# Patient Record
Sex: Male | Born: 1940 | Race: White | Hispanic: No | Marital: Married | Smoking: Never smoker
Health system: Southern US, Community
[De-identification: ages and names within clinical notes are randomized; demographics above are authoritative.]

## PROBLEM LIST (undated history)

## (undated) DIAGNOSIS — E785 Hyperlipidemia, unspecified: Secondary | ICD-10-CM

## (undated) HISTORY — DX: Hyperlipidemia, unspecified: E78.5

---

## 1999-02-28 ENCOUNTER — Ambulatory Visit (HOSPITAL_BASED_OUTPATIENT_CLINIC_OR_DEPARTMENT_OTHER): Admission: RE | Admit: 1999-02-28 | Discharge: 1999-02-28 | Payer: Self-pay | Admitting: Orthopaedic Surgery

## 2000-07-15 ENCOUNTER — Encounter: Admission: RE | Admit: 2000-07-15 | Discharge: 2000-07-15 | Payer: Self-pay | Admitting: Family Medicine

## 2000-07-15 ENCOUNTER — Encounter: Payer: Self-pay | Admitting: Family Medicine

## 2002-05-20 ENCOUNTER — Encounter: Admission: RE | Admit: 2002-05-20 | Discharge: 2002-05-20 | Payer: Self-pay | Admitting: Family Medicine

## 2002-05-20 ENCOUNTER — Encounter: Payer: Self-pay | Admitting: Family Medicine

## 2009-04-26 ENCOUNTER — Ambulatory Visit: Payer: Self-pay | Admitting: Gastroenterology

## 2011-11-06 ENCOUNTER — Ambulatory Visit: Payer: Self-pay | Admitting: Anesthesiology

## 2011-11-08 ENCOUNTER — Ambulatory Visit: Payer: Self-pay | Admitting: Podiatry

## 2012-08-12 ENCOUNTER — Other Ambulatory Visit (HOSPITAL_COMMUNITY): Payer: Self-pay | Admitting: Internal Medicine

## 2012-08-12 DIAGNOSIS — R079 Chest pain, unspecified: Secondary | ICD-10-CM

## 2012-08-12 DIAGNOSIS — R0602 Shortness of breath: Secondary | ICD-10-CM

## 2012-08-14 ENCOUNTER — Ambulatory Visit (HOSPITAL_COMMUNITY)
Admission: RE | Admit: 2012-08-14 | Discharge: 2012-08-14 | Disposition: A | Discharge status: Home / Self Care | Payer: Medicare Other | Source: Ambulatory Visit | Attending: Internal Medicine | Admitting: Internal Medicine

## 2012-08-14 DIAGNOSIS — R0989 Other specified symptoms and signs involving the circulatory and respiratory systems: Secondary | ICD-10-CM | POA: Insufficient documentation

## 2012-08-14 DIAGNOSIS — R0609 Other forms of dyspnea: Secondary | ICD-10-CM | POA: Insufficient documentation

## 2012-08-14 DIAGNOSIS — R079 Chest pain, unspecified: Secondary | ICD-10-CM | POA: Insufficient documentation

## 2012-08-14 DIAGNOSIS — R002 Palpitations: Secondary | ICD-10-CM | POA: Insufficient documentation

## 2012-08-14 DIAGNOSIS — R0602 Shortness of breath: Secondary | ICD-10-CM

## 2012-08-14 DIAGNOSIS — I10 Essential (primary) hypertension: Secondary | ICD-10-CM | POA: Insufficient documentation

## 2012-08-14 MED ORDER — TECHNETIUM TC 99M SESTAMIBI GENERIC - CARDIOLITE
30.4000 | Freq: Once | INTRAVENOUS | Status: AC | PRN
  Administered 2012-08-14: 30 via INTRAVENOUS

## 2012-08-14 MED ORDER — TECHNETIUM TC 99M SESTAMIBI GENERIC - CARDIOLITE
10.3000 | Freq: Once | INTRAVENOUS | Status: AC | PRN
  Administered 2012-08-14: 10 via INTRAVENOUS

## 2012-08-14 NOTE — Procedures (Addendum)
[facility] [address] [place] [address] [place] [address] [phone]  Cardiology Nuclear Med Study  Ronnie Patel is a 72 y.o. male     MRN : [id number]     DOB: 1940/10/14  Procedure Date: 08/14/2012  Nuclear Med Background Indication for Stress Test:  Evaluation for Ischemia History:  No prior cardiac history Cardiac Risk Factors: Family History - CAD, History of Smoking, Hypertension and Lipids  Symptoms:  Chest Pain, DOE, Fatigue and Palpitations   Nuclear Pre-Procedure Caffeine/Decaff Intake:  10:00pm NPO After: 8:00am   IV Site: R Antecubital  IV 0.9% NS with Angio Cath:  22g  Chest Size (in):  42 IV Started by: Koren Shiver, CNMT  Height: 5\' 10"  (1.778 m)  Cup Size: n/a  BMI:  Body mass index is 31.85 kg/(m^2). Weight:  222 lb (100.699 kg)   Tech Comments:  n/a    Nuclear Med Study 1 or 2 day study: 1 day  Stress Test Type:  Stress  Order Authorizing Provider:  Zoila Shutter, MD   Resting Radionuclide: Technetium 40m Sestamibi  Resting Radionuclide Dose: 10.3 mCi   Stress Radionuclide:  Technetium 69m Sestamibi  Stress Radionuclide Dose: 30.4 mCi           Stress Protocol Rest HR: 75 Stress HR: 153  Rest BP: 134/91 Stress BP: 212/91  Exercise Time (min): 7:00 METS: 7.0   Predicted Max HR: 151 bpm % Max HR: 101.32 bpm Rate Pressure Product: 64403   Dose of Adenosine (mg):  n/a Dose of Lexiscan: n/a mg  Dose of Atropine (mg): n/a Dose of Dobutamine: n/a mcg/kg/min (at max HR)  Stress Test Technologist: Esperanza Sheets, CCT Nuclear Technologist: Gonzella Lex, CNMT   Rest Procedure:  Myocardial perfusion imaging was performed at rest 45 minutes following the intravenous administration of Technetium 42m Sestamibi. Stress Procedure:  The patient performed treadmill exercise using a Bruce  Protocol for 7:00 minutes. The patient stopped due to SOB and denied any chest pain.  There were no significant ST-T wave  changes.  Technetium 51m Sestamibi was injected at peak exercise and myocardial perfusion imaging was performed after a brief delay.  Transient Ischemic Dilatation (Normal <1.22):  0.88 Lung/Heart Ratio (Normal <0.45):  0.29 QGS EDV:  67 ml QGS ESV:  24 ml LV Ejection Fraction: 64%     Rest ECG: NSR - Normal EKG  Stress ECG: No significant change from baseline ECG  QPS Raw Data Images:  Normal; no motion artifact; normal heart/lung ratio. Stress Images:  Normal homogeneous uptake in all areas of the myocardium. Rest Images:  Normal homogeneous uptake in all areas of the myocardium. Subtraction (SDS):  No evidence of ischemia. LV Wall Motion:  NL LV Function; NL Wall Motion  Impression Exercise Capacity:  Fair exercise capacity. BP Response:  Hypertensive blood pressure response. Clinical Symptoms:  No significant symptoms noted. ECG Impression:  No significant ST segment change suggestive of ischemia. Comparison with Prior Nuclear Study: No previous nuclear study performed  Overall Impression:  Normal stress nuclear study.    Thurmon Fair, MD  08/14/2012 1:22 PM

## 2012-11-08 ENCOUNTER — Encounter: Payer: Self-pay | Admitting: Internal Medicine

## 2014-03-10 DIAGNOSIS — I10 Essential (primary) hypertension: Secondary | ICD-10-CM | POA: Insufficient documentation

## 2014-03-10 DIAGNOSIS — E785 Hyperlipidemia, unspecified: Secondary | ICD-10-CM | POA: Insufficient documentation

## 2015-01-14 ENCOUNTER — Encounter: Payer: Self-pay | Admitting: *Deleted

## 2015-02-02 ENCOUNTER — Encounter: Payer: Self-pay | Admitting: Internal Medicine

## 2016-12-19 ENCOUNTER — Other Ambulatory Visit (HOSPITAL_COMMUNITY): Payer: Self-pay | Admitting: Urology

## 2016-12-19 DIAGNOSIS — D49512 Neoplasm of unspecified behavior of left kidney: Secondary | ICD-10-CM

## 2017-01-02 ENCOUNTER — Ambulatory Visit (HOSPITAL_COMMUNITY)
Admission: RE | Admit: 2017-01-02 | Discharge: 2017-01-02 | Disposition: A | Discharge status: Home / Self Care | Payer: Medicare Other | Source: Ambulatory Visit | Attending: Urology | Admitting: Urology

## 2017-01-02 DIAGNOSIS — D49512 Neoplasm of unspecified behavior of left kidney: Secondary | ICD-10-CM

## 2017-01-02 DIAGNOSIS — Q6101 Congenital single renal cyst: Secondary | ICD-10-CM | POA: Diagnosis not present

## 2017-01-02 DIAGNOSIS — M5136 Other intervertebral disc degeneration, lumbar region: Secondary | ICD-10-CM | POA: Insufficient documentation

## 2017-01-02 DIAGNOSIS — M47896 Other spondylosis, lumbar region: Secondary | ICD-10-CM | POA: Diagnosis not present

## 2017-01-02 DIAGNOSIS — D3501 Benign neoplasm of right adrenal gland: Secondary | ICD-10-CM | POA: Insufficient documentation

## 2017-01-02 LAB — POCT I-STAT CREATININE: Creatinine, Ser: 1.3 mg/dL — ABNORMAL HIGH (ref 0.61–1.24)

## 2017-01-02 MED ORDER — GADOBENATE DIMEGLUMINE 529 MG/ML IV SOLN
20.0000 mL | Freq: Once | INTRAVENOUS | Status: AC | PRN
  Administered 2017-01-02: 20 mL via INTRAVENOUS

## 2017-02-27 ENCOUNTER — Other Ambulatory Visit: Payer: Self-pay | Admitting: Sports Medicine

## 2017-02-27 DIAGNOSIS — M542 Cervicalgia: Secondary | ICD-10-CM

## 2017-03-06 ENCOUNTER — Ambulatory Visit
Admission: RE | Admit: 2017-03-06 | Discharge: 2017-03-06 | Disposition: A | Discharge status: Home / Self Care | Payer: Medicare Other | Source: Ambulatory Visit | Attending: Sports Medicine | Admitting: Sports Medicine

## 2017-03-06 DIAGNOSIS — M542 Cervicalgia: Secondary | ICD-10-CM

## 2017-03-10 ENCOUNTER — Other Ambulatory Visit: Payer: TRICARE For Life (TFL)

## 2017-06-24 ENCOUNTER — Other Ambulatory Visit: Payer: Self-pay | Admitting: Urology

## 2017-06-24 DIAGNOSIS — C61 Malignant neoplasm of prostate: Secondary | ICD-10-CM

## 2017-08-21 ENCOUNTER — Ambulatory Visit
Admission: RE | Admit: 2017-08-21 | Discharge: 2017-08-21 | Disposition: A | Discharge status: Home / Self Care | Payer: Medicare Other | Source: Ambulatory Visit | Attending: Urology | Admitting: Urology

## 2017-08-21 DIAGNOSIS — C61 Malignant neoplasm of prostate: Secondary | ICD-10-CM

## 2017-08-21 MED ORDER — GADOBENATE DIMEGLUMINE 529 MG/ML IV SOLN
20.0000 mL | Freq: Once | INTRAVENOUS | Status: AC | PRN
  Administered 2017-08-21: 20 mL via INTRAVENOUS

## 2017-09-30 IMAGING — MR MR CERVICAL SPINE W/O CM
4 of 5 series · 23 of 48 positions shown · non-contrast
Comparison: None.

CLINICAL DATA: Neck and right shoulder pain for 2 months. Right
hand numbness and weakness. Cervical pain.

EXAM:
MRI CERVICAL SPINE WITHOUT CONTRAST
TECHNIQUE: Multiplanar, multisequence MR imaging of the cervical spine was
performed. No intravenous contrast was administered.

[Series 3: T2 post-contrast · sagittal · 3.5mm · 0.35mm/px · 6 of 13 slices shown]
[im 1/13]
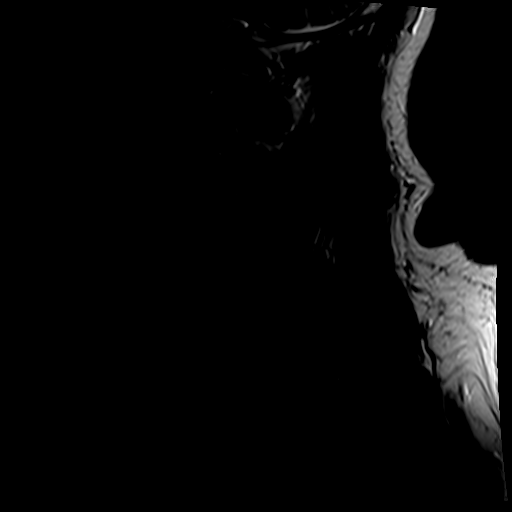
[im 3/13]
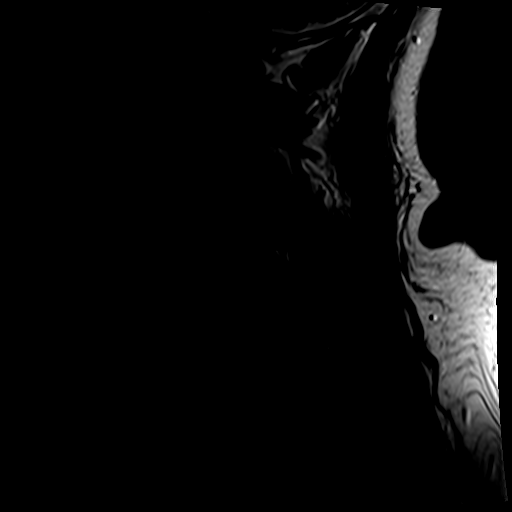
[im 5/13]
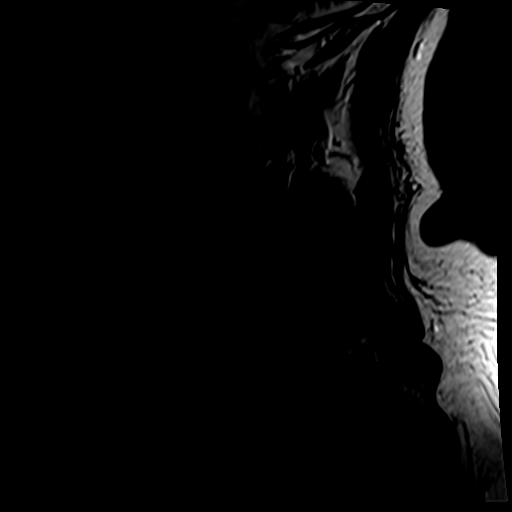
[im 8/13]
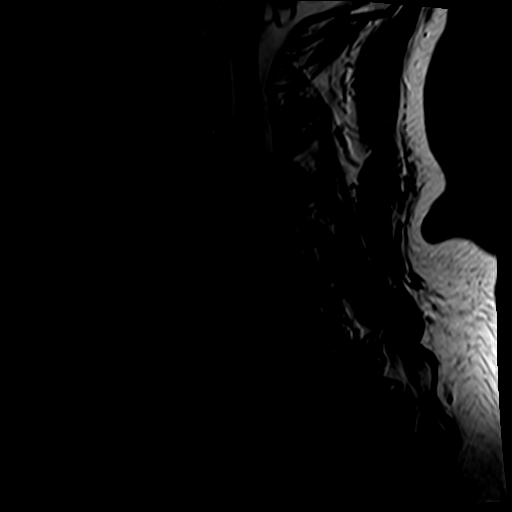
[im 10/13]
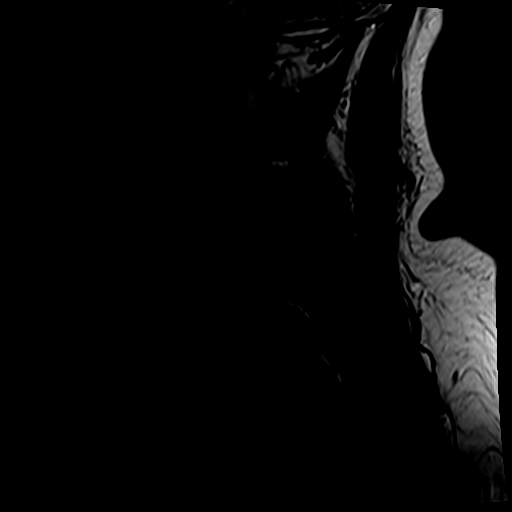
[im 13/13]
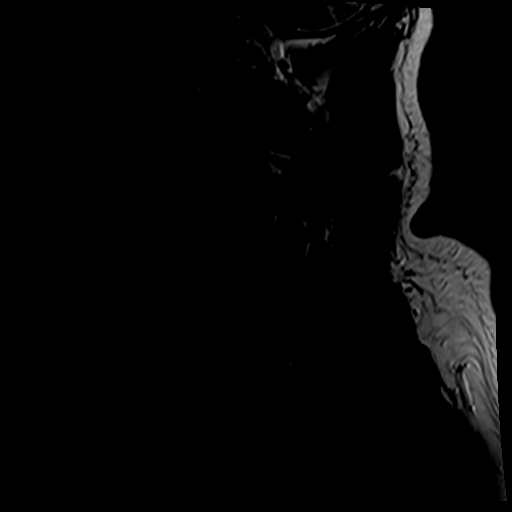

[Series 4: T1 · sagittal · 3.5mm · 0.35mm/px · 4 of 13 slices shown]
[im 1/13]
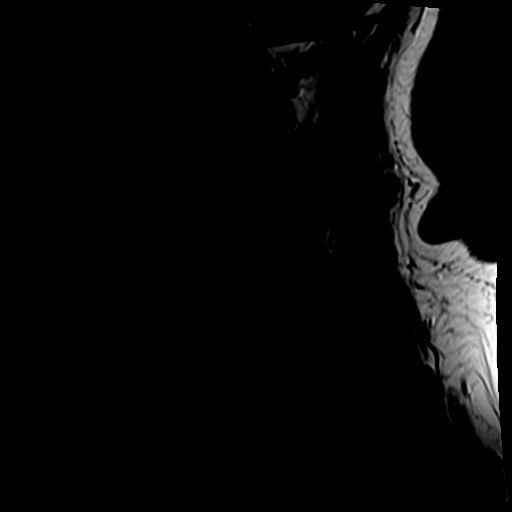
[im 4/13]
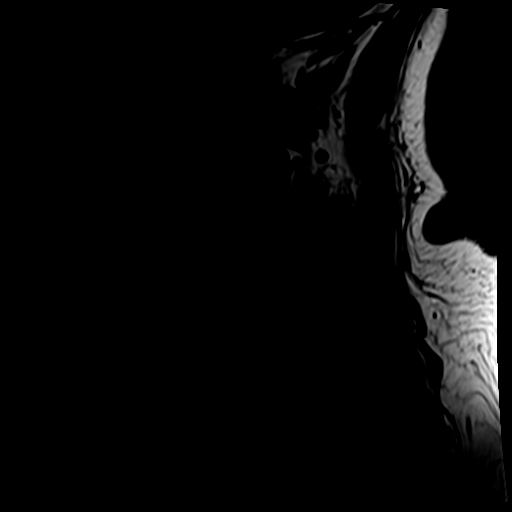
[im 7/13]
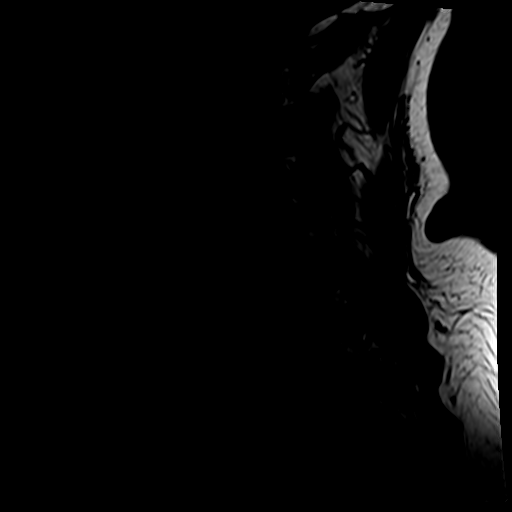
[im 13/13]
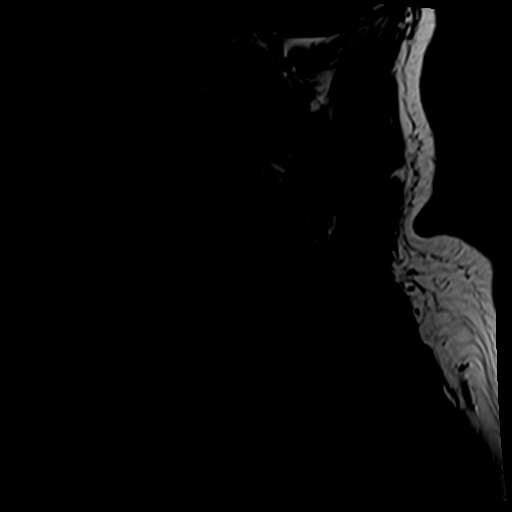

[Series 5: tir sag · sagittal · 3.5mm · 0.39mm/px · 3 of 13 slices shown]
[im 1/13]
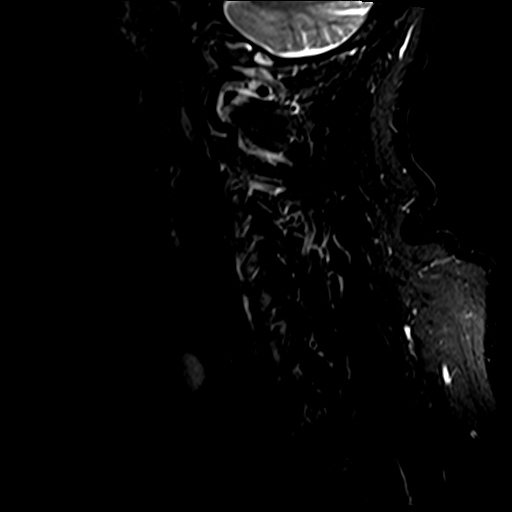
[im 7/13]
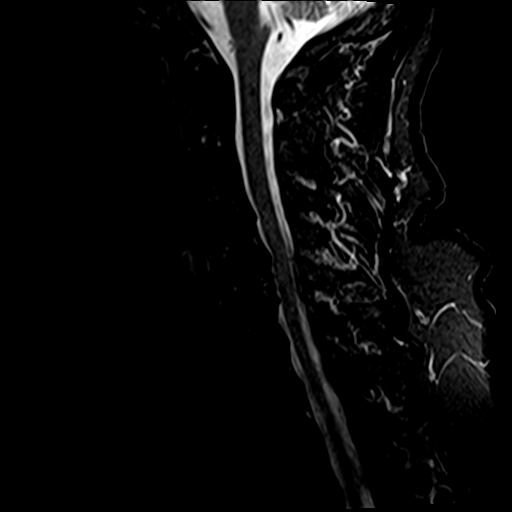
[im 13/13]
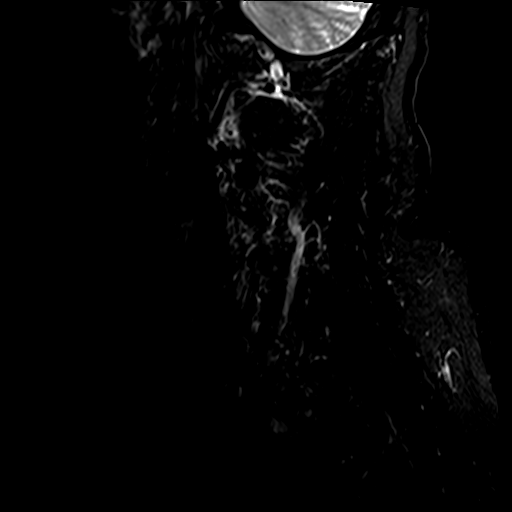

[Series 6: T2 · axial · 3.0mm · 0.39mm/px · z∈[-71,+56]mm · 10 of 40 slices shown]
[im 3/40]
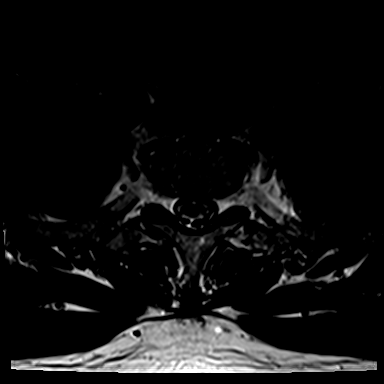
[im 6/40]
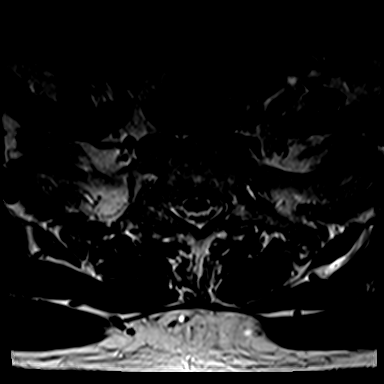
[im 8/40]
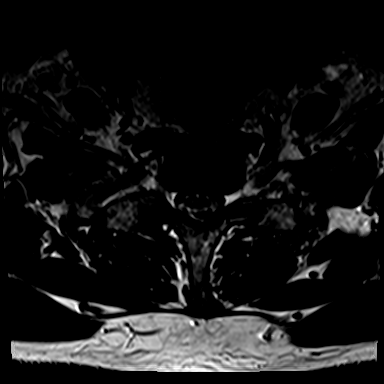
[im 14/40]
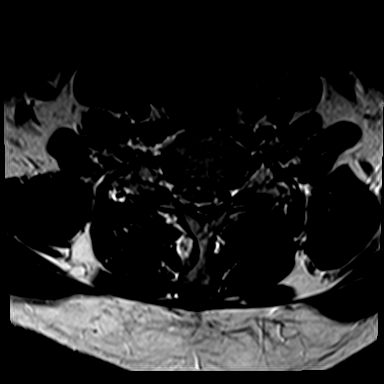
[im 19/40]
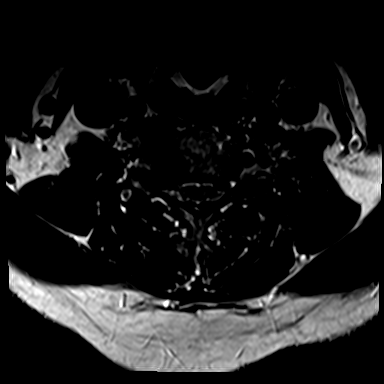
[im 21/40]
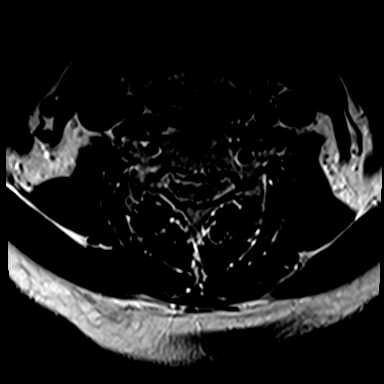
[im 24/40]
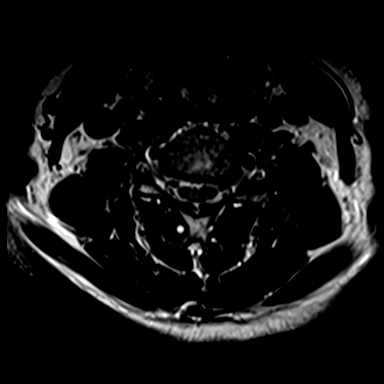
[im 29/40]
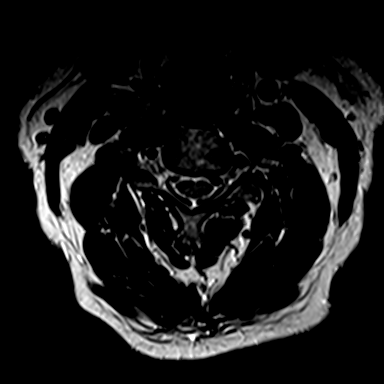
[im 34/40]
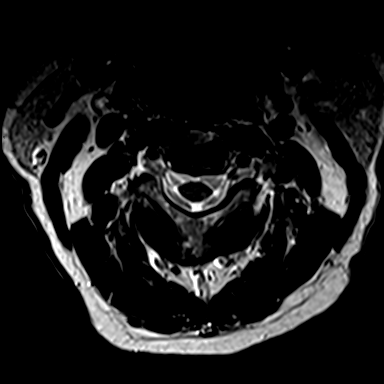
[im 40/40]
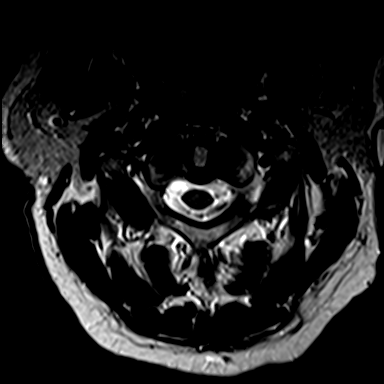

[23 of 48 positions shown; findings below may reference images not displayed]

FINDINGS: Alignment: AP alignment is anatomic. There straightening of the
normal cervical lordosis.

Vertebrae: Mild endplate marrow changes are evident at C4-5. Marrow
signal and vertebral body heights are otherwise normal.

Cord: Normal signal is present in the cervical and upper thoracic
spinal cord to the lowest imaged level, T1-2.

Posterior Fossa, vertebral arteries, paraspinal tissues: The
craniocervical junction is normal. Flow is present in the vertebral
arteries bilaterally. A 16 mm cystic nodule is present within the
left lobe of the thyroid. No definite cervical adenopathy is
present.

Disc levels:

C2-3: A shallow central disc protrusion is present without
significant stenosis.

C3-4: Mild facet hypertrophy and uncovertebral spurring is noted on
the left without significant stenosis.

C4-5: A broad-based disc osteophyte complex effaces the ventral CSF.
The canal is narrowed to 10 mm. Moderate foraminal stenosis is worse
on the left.

C5-6: A broad-based disc osteophyte complex is present.
Uncovertebral spurring is noted bilaterally. Severe right and
moderate left foraminal stenosis is present. There is effacement of
ventral CSF. The central canal is narrowed to 10 mm.

C6-7: A broad-based disc osteophyte complex present. Uncovertebral
spurring is noted bilaterally. Moderate foraminal stenosis is worse
on the left.

C7-T1: Mild uncovertebral spurring is present on the left. There is
no significant stenosis.
IMPRESSION: 1. The most significant right-sided disease is at C5-6 with severe
right and moderate left foraminal stenosis, likely impacting the C6
nerve roots.
2. Moderate foraminal stenosis bilaterally at C4-5 and C6-7 is worse
on the left.
3. Mild left-sided facet hypertrophy and uncovertebral spurring at
C3-4 without significant stenosis.
4. Mild to moderate central canal stenosis at C4-5, C5-6, and to a
lesser extent at C6-7.

## 2019-07-31 ENCOUNTER — Ambulatory Visit: Payer: Medicare Other | Attending: Internal Medicine

## 2019-07-31 DIAGNOSIS — Z23 Encounter for immunization: Secondary | ICD-10-CM

## 2019-07-31 NOTE — Progress Notes (Signed)
   Covid-19 Vaccination Clinic  Name:  Ronnie Patel    MRN: [id number] DOB: [birth year]  07/31/2019  [name] was observed post Covid-19 immunization for 15 minutes  without incidence. She was provided with Vaccine Information Sheet and instruction to access the V-Safe system.   [name] was instructed to call 911 with any severe reactions post vaccine: Marland Kitchen Difficulty breathing  . Swelling of your face and throat  . A fast heartbeat  . A bad rash all over your body  . Dizziness and weakness    Immunizations Administered    Name Date Dose VIS Date Route   Pfizer COVID-19 Vaccine 07/31/2019 10:42 AM 0.3 mL 06/19/2019 Intramuscular   Manufacturer: [place]   Lot: [id number]   [place]: [id number]

## 2019-08-21 ENCOUNTER — Ambulatory Visit: Payer: Medicare Other | Attending: Internal Medicine

## 2019-08-21 DIAGNOSIS — Z23 Encounter for immunization: Secondary | ICD-10-CM | POA: Insufficient documentation

## 2019-08-21 NOTE — Progress Notes (Signed)
   Covid-19 Vaccination Clinic  Name:  Ronnie Patel    MRN: [id number] DOB: Dec 04, 1940  08/21/2019  Ronnie Patel was observed post Covid-19 immunization for 15 minutes without incidence. She was provided with Vaccine Information Sheet and instruction to access the V-Safe system.   Ronnie Patel was instructed to call 911 with any severe reactions post vaccine: Marland Kitchen Difficulty breathing  . Swelling of your face and throat  . A fast heartbeat  . A bad rash all over your body  . Dizziness and weakness    Immunizations Administered    Name Date Dose VIS Date Route   Pfizer COVID-19 Vaccine 08/21/2019  1:37 PM 0.3 mL 06/19/2019 Intramuscular   Manufacturer: [place]   Lot: [id number]   [place]: [id number]

## 2020-06-27 LAB — COLOGUARD: COLOGUARD: NEGATIVE

## 2023-09-25 DIAGNOSIS — E119 Type 2 diabetes mellitus without complications: Secondary | ICD-10-CM | POA: Insufficient documentation

## 2024-01-27 ENCOUNTER — Encounter (INDEPENDENT_AMBULATORY_CARE_PROVIDER_SITE_OTHER): Payer: Self-pay

## 2024-01-27 ENCOUNTER — Encounter (INDEPENDENT_AMBULATORY_CARE_PROVIDER_SITE_OTHER): Payer: Self-pay | Admitting: Nurse Practitioner

## 2024-03-27 ENCOUNTER — Other Ambulatory Visit (INDEPENDENT_AMBULATORY_CARE_PROVIDER_SITE_OTHER): Payer: Self-pay | Admitting: Nurse Practitioner

## 2024-03-27 DIAGNOSIS — R209 Unspecified disturbances of skin sensation: Secondary | ICD-10-CM

## 2024-03-31 ENCOUNTER — Ambulatory Visit (INDEPENDENT_AMBULATORY_CARE_PROVIDER_SITE_OTHER): Payer: Self-pay

## 2024-03-31 ENCOUNTER — Ambulatory Visit (INDEPENDENT_AMBULATORY_CARE_PROVIDER_SITE_OTHER): Admitting: Nurse Practitioner

## 2024-03-31 ENCOUNTER — Encounter (INDEPENDENT_AMBULATORY_CARE_PROVIDER_SITE_OTHER): Payer: Self-pay | Admitting: Nurse Practitioner

## 2024-03-31 VITALS — BP 135/82 | HR 69 | Resp 16 | Ht 70.0 in | Wt 205.6 lb

## 2024-03-31 DIAGNOSIS — E785 Hyperlipidemia, unspecified: Secondary | ICD-10-CM | POA: Diagnosis not present

## 2024-03-31 DIAGNOSIS — I739 Peripheral vascular disease, unspecified: Secondary | ICD-10-CM

## 2024-03-31 DIAGNOSIS — R209 Unspecified disturbances of skin sensation: Secondary | ICD-10-CM | POA: Diagnosis not present

## 2024-03-31 DIAGNOSIS — E119 Type 2 diabetes mellitus without complications: Secondary | ICD-10-CM | POA: Diagnosis not present

## 2024-03-31 DIAGNOSIS — I10 Essential (primary) hypertension: Secondary | ICD-10-CM

## 2024-03-31 NOTE — Progress Notes (Signed)
 Subjective:    Patient ID: Ronnie Patel, adult    DOB: Jul 15, 1940, 83 y.o.   MRN: [id number] Chief Complaint  Patient presents with   New Patient (Initial Visit)    Legs and feet cold when laying down recently per wife/ pt concerned about tests done with arteries in neck    The patient is an 83 year old male who presents today for evaluation in regards to having cold feet.  He notes that when he would elevate his legs his feet would get cold and his wife is concerned that he may have some circulatory issues.  He notes that since this began it is actually resolved and he does not necessarily have that sensation any longer.  When he did begin to get cold covering his feet with a blanket would actually improve his symptoms.  He denies classic claudication-like symptoms or rest pain.  There are currently no open wounds or ulcerations.  Today the patient has right ABI 1.41 and a left of 1.38.  He has normal toe pressures bilaterally.  He has strong triphasic tibial artery waveforms bilaterally with good toe waveforms bilaterally.    Review of Systems  Cardiovascular:  Negative for leg swelling.  Skin:  Negative for wound.  All other systems reviewed and are negative.      Objective:   Physical Exam Vitals reviewed.  HENT:     Head: Normocephalic.  Cardiovascular:     Rate and Rhythm: Normal rate.     Pulses:          Dorsalis pedis pulses are detected w/ Doppler on the right side and detected w/ Doppler on the left side.       Posterior tibial pulses are detected w/ Doppler on the right side and detected w/ Doppler on the left side.  Pulmonary:     Effort: Pulmonary effort is normal.  Skin:    General: Skin is warm and dry.  Neurological:     Mental Status: She is alert and oriented to person, place, and time.  Psychiatric:        Mood and Affect: Mood normal.        Behavior: Behavior normal.        Thought Content: Thought content normal.        Judgment: Judgment normal.      BP 135/82   Pulse 69   Resp 16   Ht 5' 10 (1.778 m)   Wt 205 lb 9.6 oz (93.3 kg)   BMI 29.50 kg/m   Past Medical History:  Diagnosis Date   Hyperlipidemia     Social History   Socioeconomic History   Marital status: Married    Spouse name: Not on file   Number of children: Not on file   Years of education: Not on file   Highest education level: Not on file  Occupational History   Not on file  Tobacco Use   Smoking status: Never   Smokeless tobacco: Never  Substance and Sexual Activity   Alcohol use: Not on file   Drug use: Not on file   Sexual activity: Not on file  Other Topics Concern   Not on file  Social History Narrative   Not on file   Social Drivers of Health   Financial Resource Strain: Patient Declined (06/18/2023)   Received from [facility]   Overall Financial Resource Strain (CARDIA)    Difficulty of Paying Living Expenses: Patient declined  Food Insecurity: Patient Declined (  06/18/2023)   Received from [facility]   Hunger Vital Sign    Within the past 12 months, you worried that your food would run out before you got the money to buy more.: Patient declined    Within the past 12 months, the food you bought just didn't last and you didn't have money to get more.: Patient declined  Transportation Needs: Patient Declined (06/18/2023)   Received from [facility] - Transportation    In the past 12 months, has lack of transportation kept you from medical appointments or from getting medications?: Patient declined    Lack of Transportation (Non-Medical): Patient declined  Physical Activity: Not on file  Stress: Not on file  Social Connections: Not on file  Intimate Partner Violence: Not on file    History reviewed. No pertinent surgical history.  Family History  Problem Relation Age of Onset   Lung disease Mother        black lung   Stroke Father    Diabetes Father    Heart  attack Paternal Grandfather    Heart attack Sister    Hypertension Sister    Hyperlipidemia Sister    Diabetes Sister    Hyperlipidemia Child    Breast cancer Sister     No Known Allergies      No data to display            CMP     Component Value Date/Time   CREATININE 1.30 (H) 01/02/2017 0908     No results found.     Assessment & Plan:   1. PAD (peripheral artery disease) (Primary) Based on studies today the patient has some mild atherosclerotic changes as evidenced by his more elevated ABIs.  We had a long discussion which indicates that there is some atherosclerosis but it is not flow-limiting.  Based on that no intervention is currently recommended at this time.  Patient is advised to continue with good follow-up and control of his diabetes, hypertension hyperlipidemia.  At this time we do not recommend any medication changes.  He will follow-up with us  on an as needed basis.  2. Essential hypertension Continue antihypertensive medications as already ordered, these medications have been reviewed and there are no changes at this time.  3. Diabetes mellitus without complication (HCC) Continue hypoglycemic medications as already ordered, these medications have been reviewed and there are no changes at this time.  Hgb A1C to be monitored as already arranged by primary service  4. Hyperlipidemia, unspecified hyperlipidemia type Continue statin as ordered and reviewed, no changes at this time   Current Outpatient Medications on File Prior to Visit  Medication Sig Dispense Refill   Fingerstix Lancets MISC 1 each by Other route.     ipratropium (ATROVENT) 0.03 % nasal spray Place 2 sprays into the nose.     PAXLOVID, 150/100, 10 x 150 MG & 10 x 100MG  TBPK Take by mouth as directed.     aspirin EC 81 MG tablet Take 81 mg by mouth.     finasteride (PROSCAR) 5 MG tablet Take 5 mg by mouth daily.     simvastatin (ZOCOR) 40 MG tablet Take 40 mg by mouth at bedtime.      No current facility-administered medications on file prior to visit.    There are no Patient Instructions on file for this visit. No follow-ups on file.   Renly Roots E Emeree Mahler, NP

## 2024-04-03 LAB — VAS US ABI WITH/WO TBI
Left ABI: 1.38
Right ABI: 1.41
